# Patient Record
Sex: Male | Born: 1985 | Race: Black or African American | Hispanic: No | Marital: Single | Smoking: Current every day smoker
Health system: Southern US, Community
[De-identification: ages and names within clinical notes are randomized; demographics above are authoritative.]

## PROBLEM LIST (undated history)

## (undated) DIAGNOSIS — I10 Essential (primary) hypertension: Secondary | ICD-10-CM

## (undated) HISTORY — DX: Essential (primary) hypertension: I10

## (undated) HISTORY — PX: BACK SURGERY: SHX140

---
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
## 2003-02-21 ENCOUNTER — Emergency Department (HOSPITAL_COMMUNITY): Admission: AD | Admit: 2003-02-21 | Discharge: 2003-02-21 | Payer: Self-pay | Admitting: Emergency Medicine

## 2003-09-15 ENCOUNTER — Emergency Department (HOSPITAL_COMMUNITY): Admission: EM | Admit: 2003-09-15 | Discharge: 2003-09-15 | Payer: Self-pay | Admitting: Emergency Medicine

## 2004-08-07 ENCOUNTER — Emergency Department (HOSPITAL_COMMUNITY): Admission: EM | Admit: 2004-08-07 | Discharge: 2004-08-07 | Payer: Self-pay | Admitting: Emergency Medicine

## 2004-12-14 ENCOUNTER — Emergency Department (HOSPITAL_COMMUNITY): Admission: EM | Admit: 2004-12-14 | Discharge: 2004-12-14 | Payer: Self-pay | Admitting: Emergency Medicine

## 2004-12-25 ENCOUNTER — Emergency Department (HOSPITAL_COMMUNITY): Admission: EM | Admit: 2004-12-25 | Discharge: 2004-12-26 | Payer: Self-pay | Admitting: Emergency Medicine

## 2005-12-10 ENCOUNTER — Emergency Department (HOSPITAL_COMMUNITY): Admission: EM | Admit: 2005-12-10 | Discharge: 2005-12-11 | Payer: Self-pay | Admitting: Emergency Medicine

## 2006-02-21 ENCOUNTER — Emergency Department (HOSPITAL_COMMUNITY): Admission: EM | Admit: 2006-02-21 | Discharge: 2006-02-21 | Payer: Self-pay | Admitting: Emergency Medicine

## 2006-03-23 ENCOUNTER — Emergency Department (HOSPITAL_COMMUNITY): Admission: EM | Admit: 2006-03-23 | Discharge: 2006-03-24 | Payer: Self-pay | Admitting: Emergency Medicine

## 2006-05-16 ENCOUNTER — Emergency Department (HOSPITAL_COMMUNITY): Admission: EM | Admit: 2006-05-16 | Discharge: 2006-05-17 | Payer: Self-pay | Admitting: Emergency Medicine

## 2006-05-22 ENCOUNTER — Emergency Department (HOSPITAL_COMMUNITY): Admission: EM | Admit: 2006-05-22 | Discharge: 2006-05-23 | Payer: Self-pay | Admitting: Emergency Medicine

## 2006-09-26 ENCOUNTER — Emergency Department (HOSPITAL_COMMUNITY): Admission: EM | Admit: 2006-09-26 | Discharge: 2006-09-26 | Payer: Self-pay | Admitting: Emergency Medicine

## 2006-10-21 ENCOUNTER — Inpatient Hospital Stay (HOSPITAL_COMMUNITY): Admission: EM | Admit: 2006-10-21 | Discharge: 2006-10-22 | Payer: Self-pay | Admitting: Emergency Medicine

## 2008-07-26 ENCOUNTER — Inpatient Hospital Stay (HOSPITAL_COMMUNITY): Admission: AC | Admit: 2008-07-26 | Discharge: 2008-08-03 | Payer: Self-pay

## 2008-08-06 ENCOUNTER — Emergency Department (HOSPITAL_COMMUNITY): Admission: EM | Admit: 2008-08-06 | Discharge: 2008-08-06 | Payer: Self-pay | Admitting: Emergency Medicine

## 2008-08-07 ENCOUNTER — Emergency Department (HOSPITAL_COMMUNITY): Admission: EM | Admit: 2008-08-07 | Discharge: 2008-08-07 | Payer: Self-pay | Admitting: Emergency Medicine

## 2008-08-20 ENCOUNTER — Emergency Department (HOSPITAL_COMMUNITY): Admission: EM | Admit: 2008-08-20 | Discharge: 2008-08-20 | Payer: Self-pay | Admitting: Emergency Medicine

## 2010-11-26 LAB — COMPREHENSIVE METABOLIC PANEL
BUN: 3 mg/dL — ABNORMAL LOW (ref 6–23)
CO2: 26 mEq/L (ref 19–32)
Calcium: 9.2 mg/dL (ref 8.4–10.5)
Chloride: 107 mEq/L (ref 96–112)
Creatinine, Ser: 1.06 mg/dL (ref 0.4–1.5)
GFR calc Af Amer: >60 mL/min (ref >60)
GFR calc non Af Amer: >60 mL/min (ref >60)
Glucose, Bld: 99 mg/dL (ref 70–99)
Total Bilirubin: 0.5 mg/dL (ref 0.3–1.2)

## 2010-11-26 LAB — URINALYSIS, ROUTINE W REFLEX MICROSCOPIC
Bilirubin Urine: NEGATIVE
Glucose, UA: NEGATIVE mg/dL
Hgb urine dipstick: NEGATIVE
Specific Gravity, Urine: 1.016 (ref 1.005–1.030)
Urobilinogen, UA: 0.2 mg/dL (ref 0.0–1.0)

## 2010-11-26 LAB — DIFFERENTIAL
Basophils Absolute: 0 10*3/uL (ref 0.0–0.1)
Eosinophils Relative: 3 % (ref 0–5)
Lymphocytes Relative: 35 % (ref 12–46)
Lymphs Abs: 1.9 10*3/uL (ref 0.7–4.0)
Neutro Abs: 2.9 10*3/uL (ref 1.7–7.7)
Neutrophils Relative %: 53 % (ref 43–77)

## 2010-11-26 LAB — CBC
HCT: 38.2 % — ABNORMAL LOW (ref 39.0–52.0)
MCHC: 32.5 g/dL (ref 30.0–36.0)
MCV: 84.6 fL (ref 78.0–100.0)
RBC: 4.52 MIL/uL (ref 4.22–5.81)
WBC: 5.5 10*3/uL (ref 4.0–10.5)

## 2010-11-26 LAB — LIPASE, BLOOD: Lipase: 39 U/L (ref 11–59)
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
## 2011-05-17 LAB — TYPE AND SCREEN
ABO/RH(D): O POS
Antibody Screen: NEGATIVE

## 2011-05-17 LAB — CBC
HCT: 43 % (ref 39.0–52.0)
HCT: 42.5 % (ref 39.0–52.0)
HCT: 42.6 % (ref 39.0–52.0)
HCT: 44.3 % (ref 39.0–52.0)
Hemoglobin: 13.6 g/dL (ref 13.0–17.0)
Hemoglobin: 13.6 g/dL (ref 13.0–17.0)
Hemoglobin: 14.4 g/dL (ref 13.0–17.0)
MCHC: 32 g/dL (ref 30.0–36.0)
MCV: 84.5 fL (ref 78.0–100.0)
MCV: 84.5 fL (ref 78.0–100.0)
MCV: 85.8 fL (ref 78.0–100.0)
Platelets: 163 10*3/uL (ref 150–400)
Platelets: 182 10*3/uL (ref 150–400)
Platelets: 222 10*3/uL (ref 150–400)
RBC: 5.05 MIL/uL (ref 4.22–5.81)
RBC: 4.77 MIL/uL (ref 4.22–5.81)
RBC: 4.97 MIL/uL (ref 4.22–5.81)
RBC: 5.3 MIL/uL (ref 4.22–5.81)
RDW: 14 % (ref 11.5–15.5)
RDW: 15.4 % (ref 11.5–15.5)
WBC: 9.2 10*3/uL (ref 4.0–10.5)
WBC: 10.4 10*3/uL (ref 4.0–10.5)
WBC: 5.1 10*3/uL (ref 4.0–10.5)
WBC: 7.5 10*3/uL (ref 4.0–10.5)
WBC: 9.2 10*3/uL (ref 4.0–10.5)
WBC: 9.8 10*3/uL (ref 4.0–10.5)

## 2011-05-17 LAB — COMPREHENSIVE METABOLIC PANEL
AST: 26 U/L (ref 0–37)
Albumin: 3.7 g/dL (ref 3.5–5.2)
BUN: 10 mg/dL (ref 6–23)
Calcium: 9.5 mg/dL (ref 8.4–10.5)
Chloride: 97 mEq/L (ref 96–112)
Creatinine, Ser: 1.01 mg/dL (ref 0.4–1.5)
GFR calc non Af Amer: >60 mL/min (ref >60)
Potassium: 4 mEq/L (ref 3.5–5.1)
Sodium: 135 mEq/L (ref 135–145)

## 2011-05-17 LAB — URINALYSIS, ROUTINE W REFLEX MICROSCOPIC
Bilirubin Urine: NEGATIVE
Nitrite: NEGATIVE
Specific Gravity, Urine: >1.046 — ABNORMAL HIGH (ref 1.005–1.030)
Urobilinogen, UA: 4 mg/dL — ABNORMAL HIGH (ref 0.0–1.0)
pH: 7.5 (ref 5.0–8.0)

## 2011-05-17 LAB — URINALYSIS, MICROSCOPIC ONLY
Bilirubin Urine: NEGATIVE
Ketones, ur: 15 mg/dL — AB
Nitrite: NEGATIVE
Protein, ur: NEGATIVE mg/dL
pH: 5.5 (ref 5.0–8.0)

## 2011-05-17 LAB — BASIC METABOLIC PANEL
BUN: 4 mg/dL — ABNORMAL LOW (ref 6–23)
CO2: 28 mEq/L (ref 19–32)
Calcium: 8.9 mg/dL (ref 8.4–10.5)
Chloride: 93 mEq/L — ABNORMAL LOW (ref 96–112)
Creatinine, Ser: 1.09 mg/dL (ref 0.4–1.5)
Creatinine, Ser: 1.25 mg/dL (ref 0.4–1.5)
GFR calc Af Amer: >60 mL/min (ref >60)
GFR calc Af Amer: >60 mL/min (ref >60)
GFR calc non Af Amer: >60 mL/min (ref >60)
Potassium: 4.4 mEq/L (ref 3.5–5.1)
Sodium: 134 mEq/L — ABNORMAL LOW (ref 135–145)

## 2011-05-17 LAB — POCT I-STAT, CHEM 8
BUN: 4 mg/dL — ABNORMAL LOW (ref 6–23)
Calcium, Ion: 0.99 mmol/L — ABNORMAL LOW (ref 1.12–1.32)
Chloride: 106 meq/L (ref 96–112)
Glucose, Bld: 116 mg/dL — ABNORMAL HIGH (ref 70–99)
HCT: 47 % (ref 39.0–52.0)
TCO2: 23 mmol/L (ref 0–100)

## 2011-05-17 LAB — DIFFERENTIAL
Eosinophils Relative: 2 % (ref 0–5)
Lymphocytes Relative: 12 % (ref 12–46)
Lymphs Abs: 1.1 10*3/uL (ref 0.7–4.0)
Neutro Abs: 7.5 10*3/uL (ref 1.7–7.7)

## 2011-05-17 LAB — PROTIME-INR
INR: 1 (ref 0.00–1.49)
Prothrombin Time: 13.8 s (ref 11.6–15.2)

## 2011-05-17 LAB — LIPASE, BLOOD: Lipase: 56 U/L (ref 11–59)

## 2011-05-17 LAB — ABO/RH: ABO/RH(D): O POS

## 2021-08-22 ENCOUNTER — Encounter (HOSPITAL_COMMUNITY): Payer: Self-pay

## 2021-08-22 ENCOUNTER — Other Ambulatory Visit: Payer: Self-pay | Admitting: Nurse Practitioner

## 2021-08-22 ENCOUNTER — Ambulatory Visit (HOSPITAL_COMMUNITY): Admission: RE | Admit: 2021-08-22 | Payer: Self-pay | Source: Ambulatory Visit

## 2021-08-22 ENCOUNTER — Ambulatory Visit
Admission: RE | Admit: 2021-08-22 | Discharge: 2021-08-22 | Disposition: A | Discharge status: Home / Self Care | Payer: No Typology Code available for payment source | Source: Ambulatory Visit | Attending: Nurse Practitioner | Admitting: Nurse Practitioner

## 2021-08-22 ENCOUNTER — Other Ambulatory Visit: Payer: Self-pay

## 2021-08-22 DIAGNOSIS — T1490XA Injury, unspecified, initial encounter: Secondary | ICD-10-CM

## 2022-01-31 IMAGING — CR DG FOOT COMPLETE 3+V*R*
3 series · 3 of 3 positions shown · non-contrast
Comparison: 08/22/2021

CLINICAL DATA: Injury, pain

EXAM:
RIGHT FOOT COMPLETE - 3+ VIEW

[x foot ap right]
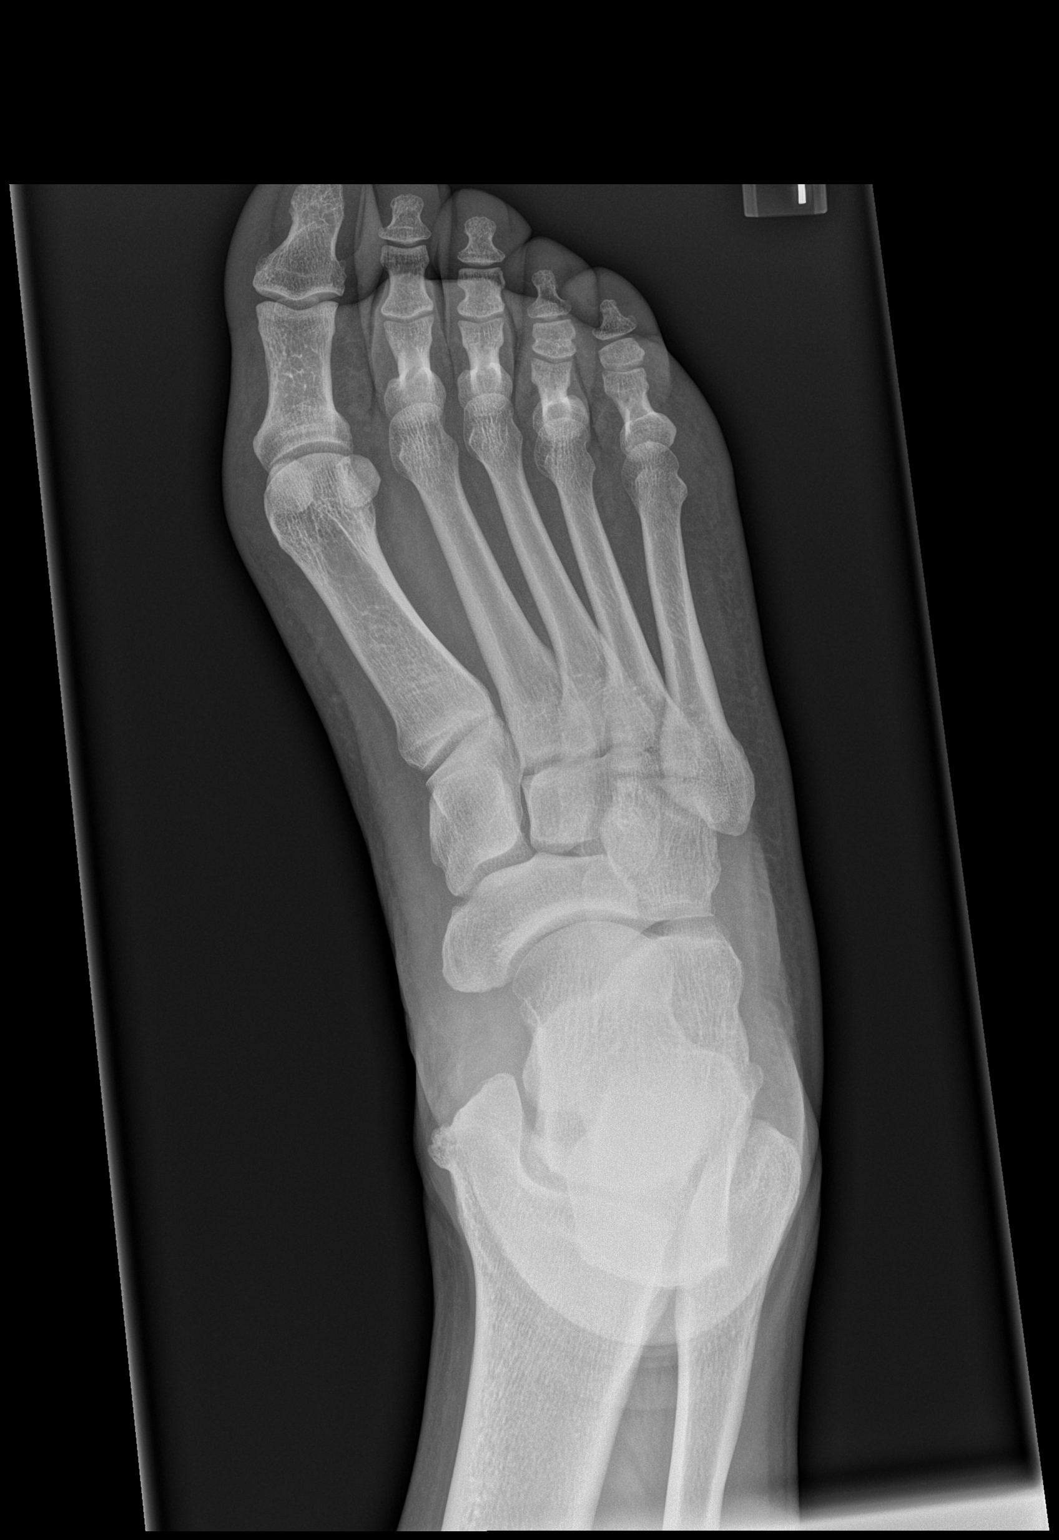

[x foot obl right]
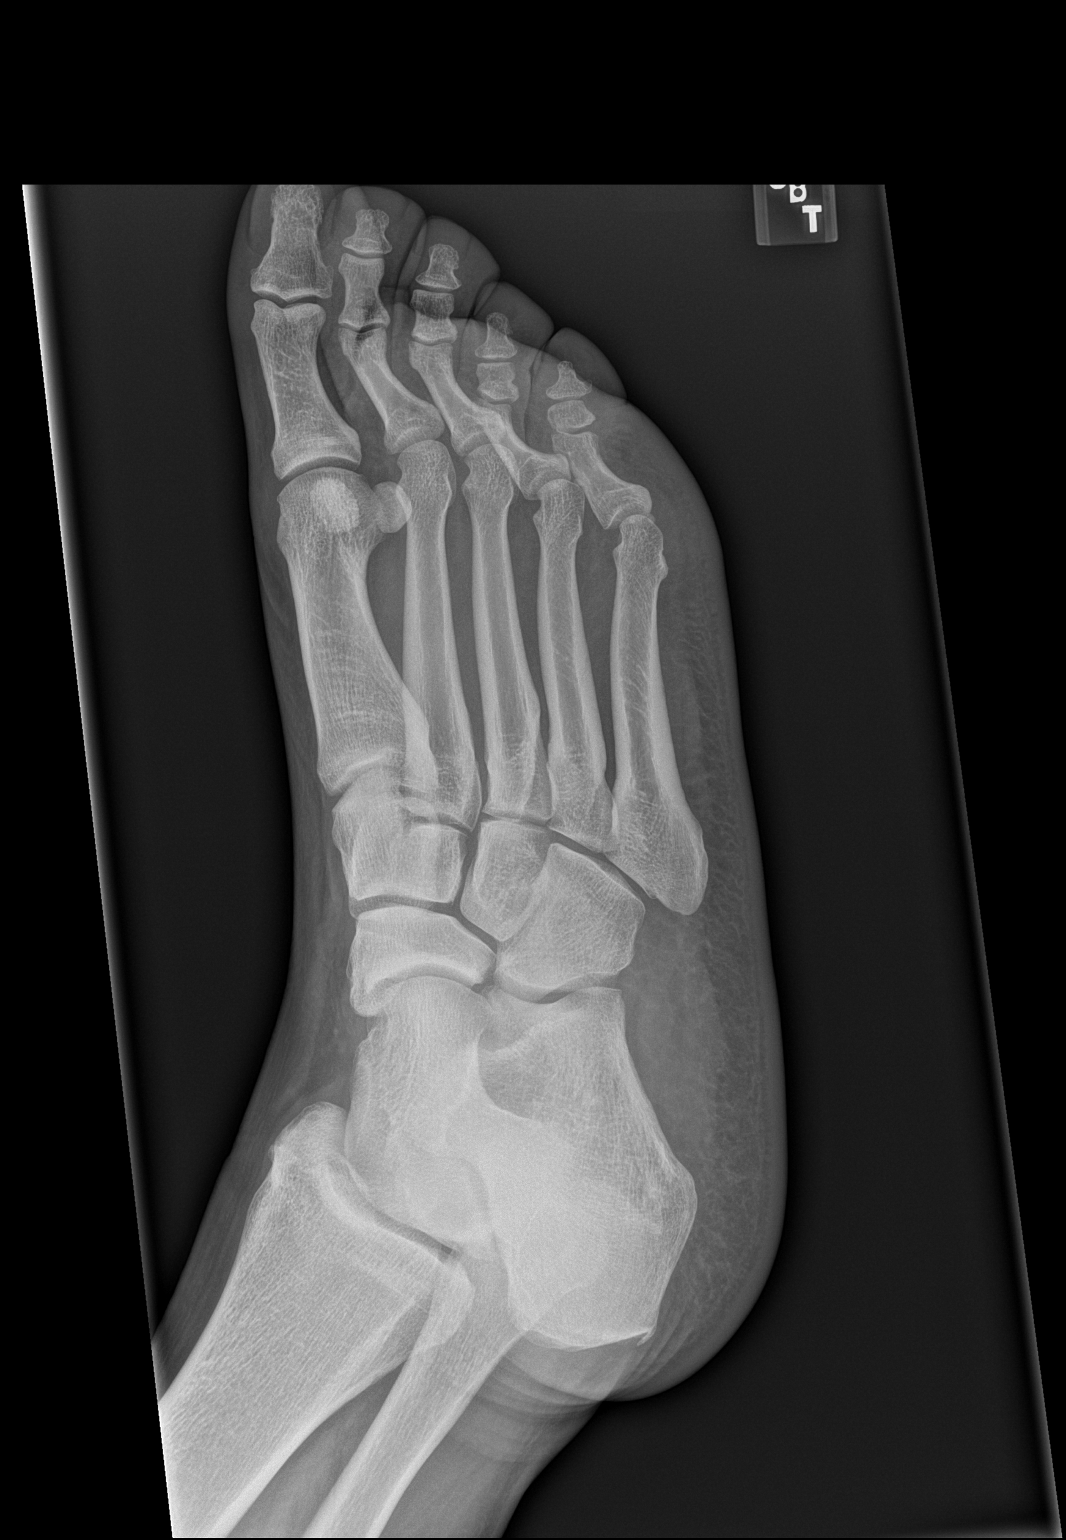

[x foot lat right]
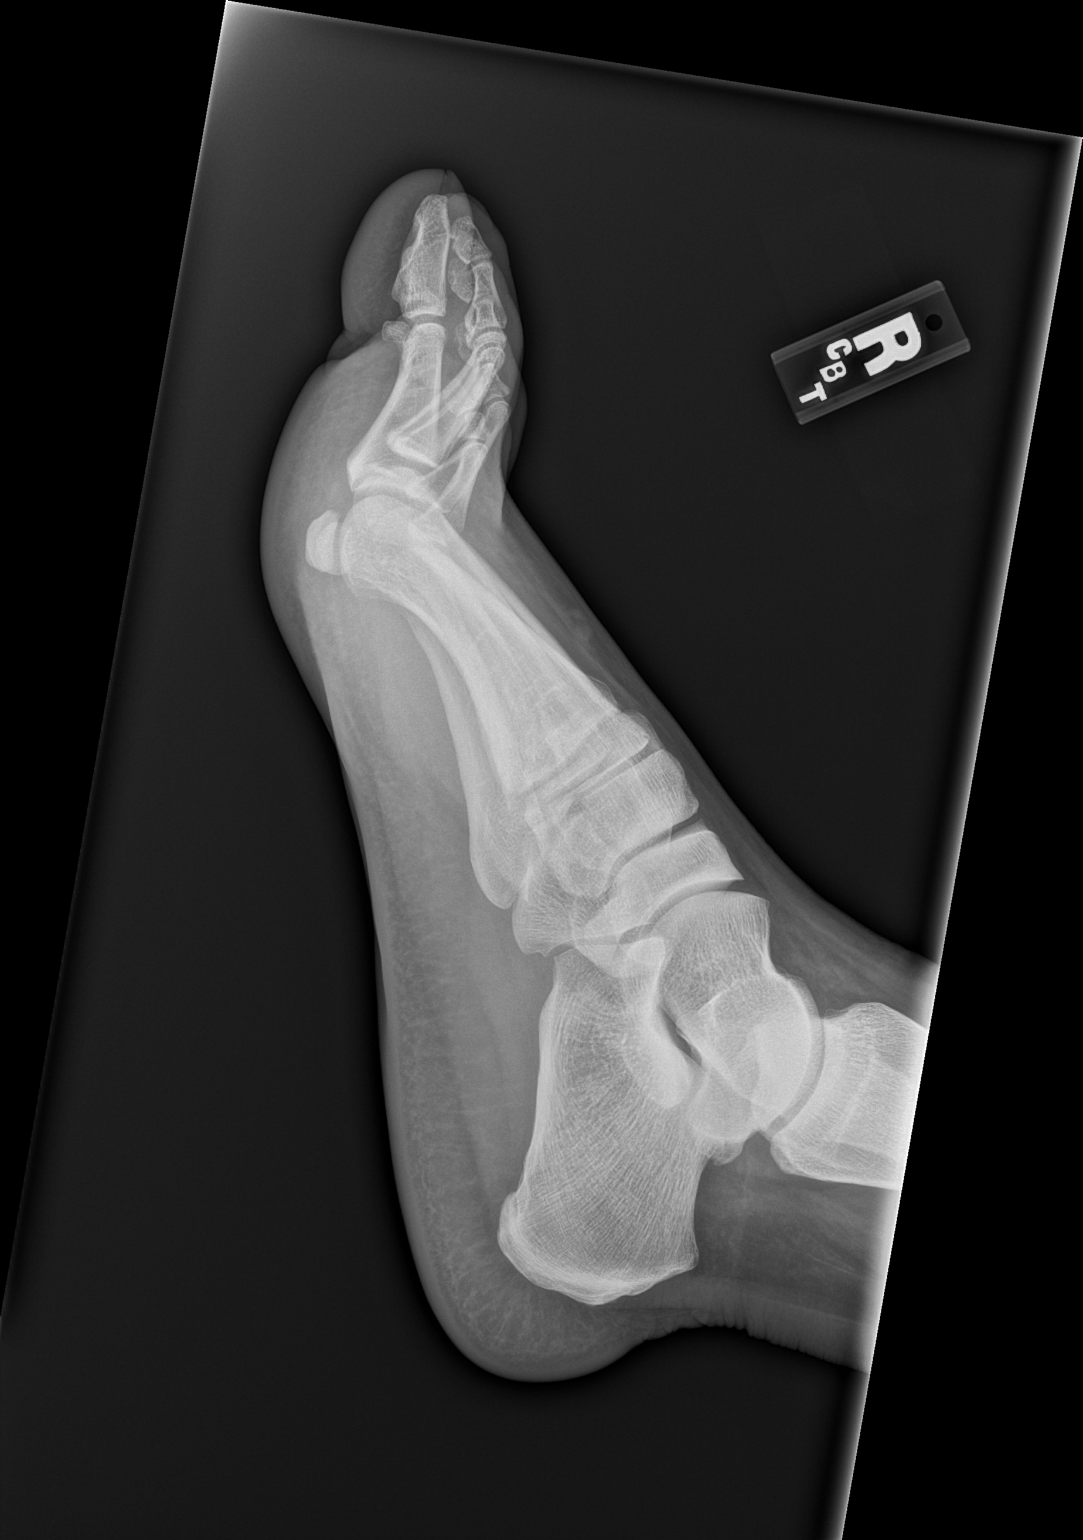

[3 of 3 positions shown; findings below may reference images not displayed]

FINDINGS: There is no evidence of fracture or dislocation. There is no
evidence of arthropathy or other focal bone abnormality. Soft
tissues are unremarkable.
IMPRESSION: Negative.

## 2023-01-29 ENCOUNTER — Emergency Department (HOSPITAL_COMMUNITY): Payer: No Typology Code available for payment source

## 2023-01-29 ENCOUNTER — Other Ambulatory Visit: Payer: Self-pay

## 2023-01-29 ENCOUNTER — Emergency Department (HOSPITAL_COMMUNITY)
Admission: EM | Admit: 2023-01-29 | Discharge: 2023-01-29 | Disposition: A | Discharge status: Home / Self Care | Payer: No Typology Code available for payment source | Attending: Emergency Medicine | Admitting: Emergency Medicine

## 2023-01-29 ENCOUNTER — Encounter (HOSPITAL_COMMUNITY): Payer: Self-pay | Admitting: Emergency Medicine

## 2023-01-29 DIAGNOSIS — I10 Essential (primary) hypertension: Secondary | ICD-10-CM | POA: Diagnosis not present

## 2023-01-29 DIAGNOSIS — Y92410 Unspecified street and highway as the place of occurrence of the external cause: Secondary | ICD-10-CM | POA: Insufficient documentation

## 2023-01-29 DIAGNOSIS — M25512 Pain in left shoulder: Secondary | ICD-10-CM | POA: Insufficient documentation

## 2023-01-29 DIAGNOSIS — M542 Cervicalgia: Secondary | ICD-10-CM | POA: Diagnosis present

## 2023-01-29 DIAGNOSIS — M25511 Pain in right shoulder: Secondary | ICD-10-CM | POA: Insufficient documentation

## 2023-01-29 DIAGNOSIS — R519 Headache, unspecified: Secondary | ICD-10-CM | POA: Diagnosis not present

## 2023-01-29 MED ORDER — METHOCARBAMOL 500 MG PO TABS: 500.0000 mg | ORAL_TABLET | Freq: Two times a day (BID) | ORAL | 0 refills | Status: DC

## 2023-01-29 MED ORDER — METHOCARBAMOL 500 MG PO TABS
1000.0000 mg | ORAL_TABLET | Freq: Once | ORAL | Status: AC
  Administered 2023-01-29: 1000 mg via ORAL
  Filled 2023-01-29: qty 2

## 2023-01-29 MED ORDER — IBUPROFEN 400 MG PO TABS
400.0000 mg | ORAL_TABLET | Freq: Once | ORAL | Status: AC
  Administered 2023-01-29: 400 mg via ORAL
  Filled 2023-01-29: qty 1
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
## 2023-01-29 NOTE — Discharge Instructions (Addendum)
It was a pleasure being a part of your care. I am sending in a 10 day supply of a medication called Robaxin (a muscle relaxer). I also want you take ibuprofen and tylenol as needed for the pain as well. If the pain gets a lot worse, or you start developing symptoms like weakness, numbness, or tingling, please come back to the emergency department.

## 2023-04-11 ENCOUNTER — Ambulatory Visit (HOSPITAL_COMMUNITY)
Admission: EM | Admit: 2023-04-11 | Discharge: 2023-04-11 | Disposition: A | Discharge status: Home / Self Care | Payer: Self-pay | Attending: Emergency Medicine | Admitting: Emergency Medicine

## 2023-04-11 ENCOUNTER — Encounter (HOSPITAL_COMMUNITY): Payer: Self-pay

## 2023-04-11 DIAGNOSIS — B349 Viral infection, unspecified: Secondary | ICD-10-CM

## 2023-04-11 DIAGNOSIS — Z1152 Encounter for screening for COVID-19: Secondary | ICD-10-CM | POA: Insufficient documentation

## 2023-04-11 MED ORDER — IBUPROFEN 800 MG PO TABS
800.0000 mg | ORAL_TABLET | Freq: Three times a day (TID) | ORAL | 0 refills | Status: AC
Start: 2023-04-11 — End: ?

## 2023-04-11 NOTE — ED Provider Notes (Signed)
[facility]    CSN: [id number] Arrival date & time: 04/11/23  1924      History   Chief Complaint Chief Complaint  Patient presents with   Excessive Sweating   Generalized Body Aches    HPI Larry Mayo is a 37 y.o. male.   Patient presents to clinic for generalized bodyaches, fatigue and excessive sweating for the past 2 days.  He did take a home COVID test and it was negative.  He took a Tylenol cough and cold pill, does not think this agreed with him because he started sweating more the next day.  He has had some nausea and gastrointestinal discomfort.  He has a dry cough that is improved.  Denies increased urination or increased thirst.  He did have some muscle cramping after sweating today in his hand.  Denies recent sick contacts.  Denies chest pain or shortness of breath.   The history is provided by the patient and medical records.    Past Medical History:  Diagnosis Date   Hypertension     There are no problems to display for this patient.   Past Surgical History:  Procedure Laterality Date   BACK SURGERY         Home Medications    Prior to Admission medications   Medication Sig Start Date End Date Taking? Authorizing Provider  ibuprofen (ADVIL) 800 MG tablet Take 1 tablet (800 mg total) by mouth 3 (three) times daily. 04/11/23  Yes Lecia Esperanza, [place] N, FNP    Family History History reviewed. No pertinent family history.  Social History Social History   Tobacco Use   Smoking status: Every Day    Current packs/day: 1.00    Types: Cigarettes   Smokeless tobacco: Never  Vaping Use   Vaping status: Never Used  Substance Use Topics   Alcohol use: Never   Drug use: Never     Allergies   Codeine   Review of Systems Review of Systems  Constitutional:  Positive for chills, diaphoresis and fatigue.  HENT:  Negative for sore throat.   Respiratory:  Positive for cough. Negative for shortness of breath and wheezing.    Cardiovascular:  Negative for chest pain.     Physical Exam Triage Vital Signs ED Triage Vitals  Encounter Vitals Group     BP 04/11/23 1945 134/89     Systolic BP Percentile --      Diastolic BP Percentile --      Pulse Rate 04/11/23 1945 (!) 106     Resp 04/11/23 1945 16     Temp 04/11/23 1945 98.2 F (36.8 C)     Temp Source 04/11/23 1945 Oral     SpO2 04/11/23 1945 95 %     Weight --      Height --      Head Circumference --      Peak Flow --      Pain Score 04/11/23 1947 0     Pain Loc --      Pain Education --      Exclude from Growth Chart --    No data found.  Updated Vital Signs BP 134/89 (BP Location: Left Arm)   Pulse (!) 106   Temp 98.2 F (36.8 C) (Oral)   Resp 16   SpO2 95%   Visual Acuity Right Eye Distance:   Left Eye Distance:   Bilateral Distance:    Right Eye Near:   Left Eye Near:    Bilateral  Near:     Physical Exam Vitals and nursing note reviewed.  Constitutional:      Appearance: Normal appearance.  HENT:     Head: Normocephalic and atraumatic.     Right Ear: External ear normal.     Left Ear: External ear normal.     Nose: Nose normal.     Mouth/Throat:     Mouth: Mucous membranes are moist.  Eyes:     Conjunctiva/sclera: Conjunctivae normal.  Cardiovascular:     Rate and Rhythm: Normal rate and regular rhythm.     Heart sounds: Normal heart sounds. No murmur heard. Pulmonary:     Effort: Pulmonary effort is normal. No respiratory distress.     Breath sounds: Normal breath sounds.  Musculoskeletal:        General: Normal range of motion.  Skin:    General: Skin is warm and dry.  Neurological:     General: No focal deficit present.     Mental Status: He is alert and oriented to person, place, and time.  Psychiatric:        Mood and Affect: Mood normal.        Behavior: Behavior normal.      UC Treatments / Results  Labs (all labs ordered are listed, but only abnormal results are displayed) Labs Reviewed  SARS  CORONAVIRUS 2 (TAT 6-24 HRS)    EKG   Radiology No results found.  Procedures Procedures (including critical care time)  Medications Ordered in UC Medications - No data to display  Initial Impression / Assessment and Plan / UC Course  I have reviewed the triage vital signs and the nursing notes.  Pertinent labs & imaging results that were available during my care of the patient were reviewed by me and considered in my medical decision making (see chart for details).  Vitals and triage reviewed, patient is hemodynamically stable.  Lungs are vesicular, heart with regular rate and rhythm.  Suspect diaphoresis due to fever and viral illness.  COVID-19 swab obtained.  Symptomatic management for viral illness discussed.  Plan of care, follow-up care and return precautions given, no questions at this time.     Final Clinical Impressions(s) / UC Diagnoses   Final diagnoses:  Viral syndrome     Discharge Instructions      Overall your symptoms appear to be related to a viral illness.  For your body aches, fatigue and sweating intake and her milligrams of ibuprofen every 8 hours.  I suggest at least 64 ounces of water in addition to an electrolyte solution like Pedialyte, liquid IV or 0 sugar Gatorade.   Please return to clinic for new or urgent symptoms.     ED Prescriptions     Medication Sig Dispense Auth. Provider   ibuprofen (ADVIL) 800 MG tablet Take 1 tablet (800 mg total) by mouth 3 (three) times daily. 21 tablet Sorrel Cassetta, [place], [place]      PDMP not reviewed this encounter.   Aisley Whan, [place] N, [place] 04/11/23 2021

## 2023-04-11 NOTE — Discharge Instructions (Addendum)
Overall your symptoms appear to be related to a viral illness.  For your body aches, fatigue and sweating intake and her milligrams of ibuprofen every 8 hours.  I suggest at least 64 ounces of water in addition to an electrolyte solution like Pedialyte, liquid IV or 0 sugar Gatorade.   Please return to clinic for new or urgent symptoms.

## 2023-04-11 NOTE — ED Triage Notes (Signed)
Patient reports that he has had perfuse sweating and generalized body aches x 2 days ago.   Patient states he thinks he had a reaction with Tylenol cough and cold while taking Bp meds.

## 2023-04-12 LAB — SARS CORONAVIRUS 2 (TAT 6-24 HRS): SARS Coronavirus 2: NEGATIVE
